# Patient Record
Sex: Female | Born: 1990 | Race: White | Hispanic: Yes | Marital: Single | Smoking: Never smoker
Health system: Southern US, Community
[De-identification: ages and names within clinical notes are randomized; demographics above are authoritative.]

## PROBLEM LIST (undated history)

## (undated) ENCOUNTER — Inpatient Hospital Stay: Payer: Self-pay

## (undated) DIAGNOSIS — O09293 Supervision of pregnancy with other poor reproductive or obstetric history, third trimester: Secondary | ICD-10-CM

## (undated) DIAGNOSIS — Z8759 Personal history of other complications of pregnancy, childbirth and the puerperium: Secondary | ICD-10-CM

## (undated) DIAGNOSIS — F329 Major depressive disorder, single episode, unspecified: Secondary | ICD-10-CM

## (undated) DIAGNOSIS — Z91410 Personal history of adult physical and sexual abuse: Secondary | ICD-10-CM

## (undated) DIAGNOSIS — F32A Depression, unspecified: Secondary | ICD-10-CM

## (undated) DIAGNOSIS — Z915 Personal history of self-harm: Secondary | ICD-10-CM

## (undated) HISTORY — PX: NO PAST SURGERIES: SHX2092

---
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
## 2009-07-11 ENCOUNTER — Inpatient Hospital Stay: Payer: Self-pay | Admitting: Obstetrics and Gynecology

## 2010-03-12 ENCOUNTER — Emergency Department: Payer: Self-pay | Admitting: Emergency Medicine

## 2010-03-26 ENCOUNTER — Ambulatory Visit: Payer: Self-pay | Admitting: Family Medicine

## 2010-03-26 ENCOUNTER — Emergency Department: Payer: Self-pay | Admitting: Emergency Medicine

## 2010-03-29 ENCOUNTER — Emergency Department: Payer: Self-pay | Admitting: Emergency Medicine

## 2014-08-03 ENCOUNTER — Emergency Department: Payer: Self-pay | Admitting: Emergency Medicine

## 2015-07-16 ENCOUNTER — Emergency Department
Admission: EM | Admit: 2015-07-16 | Discharge: 2015-07-16 | Disposition: A | Discharge status: Home / Self Care | Payer: Self-pay | Attending: Emergency Medicine | Admitting: Emergency Medicine

## 2015-07-16 ENCOUNTER — Encounter: Payer: Self-pay | Admitting: Emergency Medicine

## 2015-07-16 ENCOUNTER — Emergency Department: Admission: EM | Admit: 2015-07-16 | Payer: Self-pay | Source: Home / Self Care

## 2015-07-16 DIAGNOSIS — Z7722 Contact with and (suspected) exposure to environmental tobacco smoke (acute) (chronic): Secondary | ICD-10-CM

## 2015-07-16 DIAGNOSIS — J705 Respiratory conditions due to smoke inhalation: Secondary | ICD-10-CM | POA: Insufficient documentation

## 2015-07-16 LAB — CARBOXYHEMOGLOBIN
Carboxyhemoglobin: 3.3 % (ref 1.5–9.0)
O2 SAT: 98.9 %
Total oxygen content: 92.7 mL/dL

## 2015-07-16 NOTE — ED Notes (Signed)
Okay to give meal tray and beverage to pt per Mcshane, MD  
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
## 2017-07-16 ENCOUNTER — Other Ambulatory Visit: Payer: Self-pay | Admitting: Advanced Practice Midwife

## 2017-07-16 DIAGNOSIS — Z369 Encounter for antenatal screening, unspecified: Secondary | ICD-10-CM

## 2017-07-16 LAB — OB RESULTS CONSOLE HIV ANTIBODY (ROUTINE TESTING): HIV: NONREACTIVE

## 2017-07-17 LAB — OB RESULTS CONSOLE RUBELLA ANTIBODY, IGM: RUBELLA: IMMUNE

## 2017-07-17 LAB — OB RESULTS CONSOLE VARICELLA ZOSTER ANTIBODY, IGG: Varicella: IMMUNE

## 2017-07-17 LAB — OB RESULTS CONSOLE ABO/RH: RH TYPE: POSITIVE

## 2017-07-17 LAB — OB RESULTS CONSOLE HEPATITIS B SURFACE ANTIGEN: HEP B S AG: NEGATIVE

## 2017-07-18 LAB — OB RESULTS CONSOLE GC/CHLAMYDIA
CHLAMYDIA, DNA PROBE: NEGATIVE
Gonorrhea: NEGATIVE

## 2017-07-31 ENCOUNTER — Ambulatory Visit (HOSPITAL_BASED_OUTPATIENT_CLINIC_OR_DEPARTMENT_OTHER)
Admission: RE | Admit: 2017-07-31 | Discharge: 2017-07-31 | Disposition: A | Discharge status: Home / Self Care | Payer: Self-pay | Source: Ambulatory Visit | Attending: Obstetrics and Gynecology | Admitting: Obstetrics and Gynecology

## 2017-07-31 ENCOUNTER — Ambulatory Visit
Admission: RE | Admit: 2017-07-31 | Discharge: 2017-07-31 | Disposition: A | Discharge status: Home / Self Care | Payer: Self-pay | Source: Ambulatory Visit | Attending: Obstetrics and Gynecology | Admitting: Obstetrics and Gynecology

## 2017-07-31 VITALS — BP 115/53 | HR 73 | Temp 98.1°F | Resp 18 | Wt 171.6 lb

## 2017-07-31 DIAGNOSIS — Z3682 Encounter for antenatal screening for nuchal translucency: Secondary | ICD-10-CM | POA: Insufficient documentation

## 2017-07-31 DIAGNOSIS — Z8279 Family history of other congenital malformations, deformations and chromosomal abnormalities: Secondary | ICD-10-CM | POA: Insufficient documentation

## 2017-07-31 DIAGNOSIS — N8312 Corpus luteum cyst of left ovary: Secondary | ICD-10-CM | POA: Insufficient documentation

## 2017-07-31 DIAGNOSIS — Z369 Encounter for antenatal screening, unspecified: Secondary | ICD-10-CM

## 2017-07-31 DIAGNOSIS — Z3689 Encounter for other specified antenatal screening: Secondary | ICD-10-CM | POA: Insufficient documentation

## 2017-07-31 NOTE — Progress Notes (Addendum)
Referring Provider:   [facility] Department Length of Consultation: 40 minutes   [name] was referred to [facility] for genetic counseling to discuss her family history and prenatal testing options.  This note is a summary of our discussion.  We first obtained a detailed family history. [name] reported that one paternal first cousin has Down syndrome. He is a teenager and has numerous siblings who are in good health.  He was born to his mother when she was at an older age. There is no other family history of children with birth defects, developmental delays, multiple miscarriages or chromosome conditions.  She also denied any complications during this pregnancy or exposures to medications, recreational drugs, tobacco or alcohol.  She reported preeclampsia in her first pregnancy.  She has three healthy children, and one early miscarriage.  This is the first pregnancy with her current partner.  We discussed that chromosomes are the inherited structures that contain our instructions for development (genes).  Each cell of our body normally has 46 chromosomes, matched up into 23 pairs.  The last pair determines our gender and are called the sex chromosomes.  A female has an X and a Y chromosome, while a female has two X chromosomes.  Rarely, when a mother's egg and father's sperm unite, an extra or missing chromosome can be passed on to the baby by mistake.  Changes in the number or the structure of the chromosomes may result in a child with some degree of mental retardation and physical problems.  Down syndrome is caused by having three copies (instead of the usual two copies) of the genes on chromosome number 21.  There are two types of Down syndrome.  Most often (about 95% of the time), Down syndrome is caused by an entire third copy of chromosome 21, known as Trisomy 33.  In the other cases, Down syndrome is caused by a rearrangement of the  chromosomes, known as a translocation.  Because we do not have documentation of the type of Down syndrome in this cousin, we discussed the recurrence of both types.  If it is the more common type, it is thought to be a sporadic event that would not be likely to happen again in the family and should not increase the chance for [name] to have a child with Down syndrome.  If her cousin has the translocation type of Down syndrome, the recurrence risk may be higher depending upon which, if any, family members may be translocation carriers.   We discussed the following prenatal screening and testing options for this pregnancy:  First trimester screening, which can include nuchal translucency ultrasound screen and/or first trimester maternal serum marker screening.  The nuchal translucency has approximately an 80% detection rate for Down syndrome and can be positive for other chromosome abnormalities as well as heart defects.  When combined with a maternal serum marker screening, the detection rate is up to 90% for Down syndrome and up to 97% for trisomy 18.     Maternal serum marker screening, a blood test that measures pregnancy proteins, can provide risk assessments for Down syndrome, trisomy 18, and open neural tube defects (spina bifida, anencephaly). Because it does not directly examine the fetus, it cannot positively diagnose or rule out these problems.  It is able to detect approximately 75% of babies with Down syndrome, 60% of babies with trisomy 18, and 80% of babies with spina bifida.  Targeted ultrasound  uses high frequency sound waves to create an image of the developing fetus.  An ultrasound is often recommended as a routine means of evaluating the pregnancy.  It is also used to screen for fetal anatomy problems (for example, a heart defect) that might be suggestive of a chromosomal or other abnormality.   Should either of these tests show an increased risk for a chromosome condition in  this pregnancy, then the following would be offered:   The chorionic villus sampling procedure is available for first trimester chromosome analysis.  This involves the withdrawal of a small amount of chorionic villi (tissue from the developing placenta).  Risk of pregnancy loss is estimated to be approximately 1 in 200 to 1 in 100 (0.5 to 1%).  There is approximately a 1% (1 in 100) chance that the CVS chromosome results will be unclear.  Chorionic villi cannot be tested for neural tube defects.     Amniocentesis involves the removal of a small amount of amniotic fluid from the sac surrounding the fetus ("bag of water") with the use of a thin needle inserted through the mother's abdomen and uterus.  Ultrasound guidance is used throughout the procedure.  Fetal cells from amniotic fluid are directly evaluated and > 99.5% of chromosome problems and > 98% of open neural tube defects can be detected. This procedure is generally performed after the 15th week of pregnancy.  The main risks to this procedure include complications leading to miscarriage in less than 1 in 200 cases (0.5%).We also obtained a detailed pregnancy history.  You reported no complications in the pregnancy.  You deny any illnesses, infections, recreational drugs, alcohol or tobacco.  You are currently taking Nifedipine for hypertension.  As with any medication during pregnancy, we must weigh the health of the mother against any possible adverse effects to the fetus from the medication.  Therefore, it is important that you continue any medications that you need for your continued good health.  This medication is not known to increase the risk for birth defects.  We also reviewed the availability of cell free fetal DNA testing from maternal blood to determine whether or not the baby may have either Down syndrome, trisomy 94, or trisomy 8.  This test utilizes a maternal blood sample and DNA sequencing technology to isolate circulating cell free  fetal DNA from maternal plasma.  The fetal DNA can then be analyzed for DNA sequences that are derived from the three most common chromosomes involved in aneuploidy, chromosomes 13, 18, and 21.  If the overall amount of DNA is greater than the expected level for any of these chromosomes, aneuploidy is suspected.  While we do not consider it a replacement for invasive testing and karyotype analysis, a negative result from this testing would be reassuring, though not a guarantee of a normal chromosome complement for the baby.  An abnormal result is certainly suggestive of an abnormal chromosome complement, though we would still recommend CVS or amniocentesis to confirm any findings from this testing.  Cystic Fibrosis and Spinal Muscular Atrophy (SMA) screening were also discussed with the patient. Both conditions are recessive, which means that both parents must be carriers in order to have a child with the disease.  Cystic fibrosis (CF) is one of the most common genetic conditions in persons of Caucasian ancestry.  This condition occurs in approximately 1 in 2,500 Caucasian persons and results in thickened secretions in the lungs, digestive, and reproductive systems.  For a baby to be at  risk for having CF, both of the parents must be carriers for this condition.  Approximately 1 in 7125 Caucasian persons is a carrier for CF.  Current carrier testing looks for the most common mutations in the gene for CF and can detect approximately 90% of carriers in the Caucasian population.  This means that the carrier screening can greatly reduce, but cannot eliminate, the chance for an individual to have a child with CF.  If an individual is found to be a carrier for CF, then carrier testing would be available for the partner. As part of [place] newborn screening profile, all babies born in the state of [place] will have a two-tier screening process.  Specimens are first tested to determine the concentration of  immunoreactive trypsinogen (IRT).  The top 5% of specimens with the highest IRT values then undergo DNA testing using a panel of over 40 common CF mutations. SMA is a neurodegenerative disorder that leads to atrophy of skeletal muscle and overall weakness.  This condition is also more prevalent in the Caucasian population, with 1 in 40-1 in 60 persons being a carrier and 1 in 6,000-1 in 10,000 children being affected.  There are multiple forms of the disease, with some causing death in infancy to other forms with survival into adulthood.  The genetics of SMA is complex, but carrier screening can detect up to 95% of carriers in the Caucasian population.  Similar to CF, a negative result can greatly reduce, but cannot eliminate, the chance to have a child with SMA.  We discussed the prenatal options in detail and after consideration, the patient elected to have first trimester screening.  She declined CF and SMA carrier screening.  We appreciate very much the opportunity to be involved with the care of this patient.  If the patient has further questions or concerns, please do not hesitate to call us at [phone].  Cherly Andersoneborah F. Wells, MS, CGC  I reviewed the counseling and agree with the plan Jimmey RalphLivingston, Meena Barrantes, MD

## 2017-08-04 ENCOUNTER — Ambulatory Visit
Admission: RE | Admit: 2017-08-04 | Discharge: 2017-08-04 | Disposition: A | Discharge status: Home / Self Care | Payer: Self-pay | Source: Ambulatory Visit | Attending: Advanced Practice Midwife | Admitting: Advanced Practice Midwife

## 2017-08-04 DIAGNOSIS — Z369 Encounter for antenatal screening, unspecified: Secondary | ICD-10-CM

## 2017-08-07 ENCOUNTER — Telehealth: Payer: Self-pay | Admitting: Obstetrics and Gynecology

## 2017-08-07 NOTE — Telephone Encounter (Signed)
Ms. Geanie CooleyOcampo Aranda  elected to undergo First Trimester screening on 07/31/2017.  To review, first trimester screening, includes nuchal translucency ultrasound screen and/or first trimester maternal serum marker screening.  The nuchal translucency has approximately an 80% detection rate for Down syndrome and can be positive for other chromosome abnormalities as well as heart defects.  When combined with a maternal serum marker screening, the detection rate is up to 90% for Down syndrome and up to 97% for trisomy 13 and 18.     The results of the First Trimester Nuchal Translucency and Biochemical Screening were within normal range.  The risk for Down syndrome is now estimated to be 1 in 1,374.  The risk for Trisomy 13/18 is less than 1 in 10,000.  Should more definitive information be desired, we would offer amniocentesis.  Because we do not yet know the effectiveness of combined first and second trimester screening, we do not recommend a maternal serum screen to assess the chance for chromosome conditions.  However, if screening for neural tube defects is desired, maternal serum screening for AFP only can be performed between 15 and [redacted] weeks gestation.     Cherly Andersoneborah F. Ovetta Bazzano, MS, CGC

## 2017-09-08 ENCOUNTER — Other Ambulatory Visit: Payer: Self-pay | Admitting: *Deleted

## 2017-09-08 DIAGNOSIS — Z8759 Personal history of other complications of pregnancy, childbirth and the puerperium: Secondary | ICD-10-CM

## 2017-09-11 ENCOUNTER — Ambulatory Visit
Admission: RE | Admit: 2017-09-11 | Discharge: 2017-09-11 | Disposition: A | Discharge status: Home / Self Care | Payer: Self-pay | Source: Ambulatory Visit | Attending: Maternal & Fetal Medicine | Admitting: Maternal & Fetal Medicine

## 2017-09-11 DIAGNOSIS — Z8759 Personal history of other complications of pregnancy, childbirth and the puerperium: Secondary | ICD-10-CM | POA: Insufficient documentation

## 2017-10-14 NOTE — L&D Delivery Note (Signed)
Delivery Note  First Stage: Induction: three doses of misoprostol Labor onset: contractions started around 2200 Analgesia /Anesthesia intrapartum: none SROM at 2310 for clear fluid  Second Stage: Complete dilation at 0020 Onset of pushing at 0020 FHR second stage category 1  She quickly progressed to complete and had a spontaneous vaginal birth of a live female over an intact perineum at 0023. The fetal head was delivered with restitution to ROA. Anterior then posterior shoulders delivered with minimal assistance. Baby placed on mom's chest and attended to by transition RN.   Third Stage: Placenta delivered spontaneously Schultz intact with 3 VC @ 0028 Placenta disposition: routine disposal Uterine tone firm / bleeding scant IV pitocin given for hemorrhage prophylaxis  No laceration identified  Anesthesia for repair: n/a Repair: n/a Est. Blood Loss (mL): 300mL  Complications: none  Newborn: Birth Weight: pending  Apgar Scores: 9, 9 Feeding planned: breast  Mom to postpartum.  Baby to Couplet care / Skin to Skin.  Krista [name], [place] 02/04/2018 3:32 AM

## 2017-11-24 LAB — HM HIV SCREENING LAB: HM HIV Screening: NEGATIVE

## 2017-11-25 LAB — OB RESULTS CONSOLE RPR: RPR: NONREACTIVE

## 2018-01-13 LAB — OB RESULTS CONSOLE GBS: STREP GROUP B AG: POSITIVE

## 2018-01-15 LAB — OB RESULTS CONSOLE GC/CHLAMYDIA
CHLAMYDIA, DNA PROBE: NEGATIVE
Gonorrhea: NEGATIVE

## 2018-01-27 ENCOUNTER — Other Ambulatory Visit: Payer: Self-pay

## 2018-01-27 ENCOUNTER — Observation Stay
Admission: EM | Admit: 2018-01-27 | Discharge: 2018-01-27 | Disposition: A | Discharge status: Home / Self Care | Payer: Self-pay | Attending: Obstetrics and Gynecology | Admitting: Obstetrics and Gynecology

## 2018-01-27 DIAGNOSIS — O26899 Other specified pregnancy related conditions, unspecified trimester: Secondary | ICD-10-CM | POA: Diagnosis present

## 2018-01-27 DIAGNOSIS — Z3A38 38 weeks gestation of pregnancy: Secondary | ICD-10-CM | POA: Insufficient documentation

## 2018-01-27 DIAGNOSIS — F329 Major depressive disorder, single episode, unspecified: Secondary | ICD-10-CM | POA: Insufficient documentation

## 2018-01-27 DIAGNOSIS — O99820 Streptococcus B carrier state complicating pregnancy: Principal | ICD-10-CM | POA: Insufficient documentation

## 2018-01-27 DIAGNOSIS — O99343 Other mental disorders complicating pregnancy, third trimester: Secondary | ICD-10-CM | POA: Insufficient documentation

## 2018-01-27 DIAGNOSIS — R197 Diarrhea, unspecified: Secondary | ICD-10-CM

## 2018-01-27 MED ORDER — CALCIUM CARBONATE ANTACID 500 MG PO CHEW: 2.0000 | CHEWABLE_TABLET | Freq: Three times a day (TID) | ORAL | 0 refills | Status: AC

## 2018-01-27 MED ORDER — CALCIUM CARBONATE ANTACID 500 MG PO CHEW
2.0000 | CHEWABLE_TABLET | Freq: Three times a day (TID) | ORAL | Status: DC
  Administered 2018-01-27: 400 mg via ORAL
  Filled 2018-01-27: qty 2

## 2018-01-27 NOTE — Discharge Summary (Signed)
Krista [name] is [name] 27 y.o. female. She is at 5741w2d gestation. Patient's last menstrual period was 05/09/2017. Estimated Date of Delivery: 02/08/18  Prenatal care site: [facility]   Current pregnancy complicated by:  1. GBS Pos 2. Hx Pre-e x 2 (G1 and G3) 3. Hx macrosomia with shoulder dystocia with G2   Chief complaint: Abd pain with diarrhea, unsure if she is in labor.   Location: general "all over abdomen" but points to epigastric region across abdomen.  Onset/timing: 0100 on 01/27/18 Severity: 6/10 cramping associated with episodes of diarrhea, mild constant pain.  Aggravating or alleviating conditions:none Associated signs/symptoms: occasional UCs   S: Resting comfortably. no CTX, no VB.no LOF,  Active fetal movement.  Denies: HA, visual changes, SOB, or RUQ pain.   Maternal Medical History:  No past medical history on file.  - Hx depression, prev on meds. Hx dental problems. Hx Pre-e x 2  No past surgical history on file. - No surgeries.  No Known Allergies  Prior to Admission medications   Medication Sig Start Date End Date Taking? Authorizing Provider  Prenatal Vit-Fe Fumarate-FA (PRENATAL MULTIVITAMIN) TABS tablet Take 1 tablet by mouth daily at 12 noon.    [provider]      Social History: She  reports that she has never smoked. She has never used smokeless tobacco. She reports that she does not drink alcohol or use drugs.  Family History:  no history of gyn cancers  Review of Systems: [name] full review of systems was performed and negative except as noted in the HPI.     O:  BP 114/66 (BP Location: Right Arm)   Temp 98.5 F (36.9 C) (Oral)   Ht 5\' 6"  (1.676 m)   Wt 206 lb (93.4 kg)   LMP 05/09/2017   BMI 33.25 kg/m  No results found for this or any previous visit (from the past 48 hour(s)).   Constitutional: NAD, AAOx3  HE/ENT: extraocular movements grossly intact, moist mucous membranes CV: RRR PULM: nl respiratory effort, CTABL     Abd:  gravid, non-tender, non-distended, soft      Ext: Non-tender, Nonedematous   Psych: mood appropriate, speech normal Pelvic: SVE done by nursing: 1-2/40/OOP  Fetal  monitoring: Cat I Appropriate for GA Baseline: 140bpm Variability: moderate Accelerations: absent/ present x >2 Decelerations absent Toco: irregular occasional UCs.     [name]/P: 27 y.o. 4041w2d here for antenatal surveillance for Abdominal pain and diarrhea  Labor: not present.  Diarrhea with abd cramping- reviewed preg safe medications, Bland diet, inc PO hydration.    Epigastric pain: TUMs 2 tabs given with complete relief.   Fetal Wellbeing: Reassuring Cat 1 tracing.  Reactive NST   D/c home stable, precautions reviewed, follow-up as scheduled.    Krista [name], CNM 01/27/2018  11:44 PM

## 2018-01-27 NOTE — OB Triage Note (Signed)
Pt discharged to home in stable condition. Discharge instructions reviewed with interpreter Emeline Gins(Andres [phone]). Patient verbalized understanding.

## 2018-01-27 NOTE — Discharge Instructions (Signed)
Diarrea en los adultos  (Diarrhea, Adult)  La diarrea ocurre cuando la materia fecal (heces) es frecuentemente blanda y acuosa. La diarrea puede hacerlo sentir dbil y causarle deshidratacin. La deshidratacin puede hacerlo sentir cansado y sediento, producirle sequedad en la boca y disminuir la frecuencia con la que orina. La diarrea suele durar 2 o 3das. Sin embargo, puede durar ms tiempo si se trata de un signo de algo ms serio. Es importante tratar la diarrea como se lo haya indicado el mdico.  CUIDADOS EN EL HOGAR  Comida y bebida  Siga estas recomendaciones como se lo haya indicado el mdico:   Tome una solucin de rehidratacin oral (SRO). Es una bebida que se vende en farmacias y tiendas.   Beba lquidos claros, por ejemplo:  ? Agua.  ? Cubitos de hielo.  ? Jugo de frutas diluido.  ? Bebidas deportivas de bajas caloras.   En la medida en que pueda, consuma alimentos blandos y fciles de digerir en pequeas cantidades. Ellos son:  ? Bananas.  ? Pur de manzana.  ? Arroz.  ? Carnes bajas en grasa (magras).  ? Tostadas.  ? Galletas.   Evite beber lquidos que contengan mucha azcar o cafena.   Evite el alcohol.   Evite los alimentos picantes o grasos.  Instrucciones generales   Beba suficiente lquido para mantener el pis (orina) claro o de color amarillo plido.   Lvese las manos con frecuencia. Use un desinfectante para manos si no dispone de agua y jabn.   Asegrese de que todas las personas que viven en su casa se laven bien las manos y con frecuencia.   Tome los medicamentos de venta libre y los recetados solamente como se lo haya indicado el mdico.   Descanse en su casa hasta sentirse mejor.   Controle su afeccin para ver si hay cambios.   Tome un bao con agua tibia para aliviar cualquier ardor o dolor a causa de la diarrea.   Concurra a todas las visitas de control como se lo haya indicado el mdico. Esto es importante.  SOLICITE AYUDA SI:   Tiene fiebre.   La diarrea  empeora.   Aparecen nuevos sntomas.   No puede retener los lquidos.   Se siente mareado o siente que va a desvanecerse.   Tiene dolores de cabeza.   Tiene calambres musculares.  SOLICITE AYUDA DE INMEDIATO SI:   Siente dolor en el pecho.   Se siente muy dbil o se desvanece (se desmaya).   Tiene heces con sangre, de color negro o con aspecto alquitranado.   Tiene dolor muy intenso en el vientre (abdomen), clicos o meteorismo.   Le cuesta respirar o respira muy rpidamente.   Su corazn late muy rpidamente.   Siente la piel fra y hmeda.   Se siente confundido.   Tiene signos de deshidratacin, por ejemplo:  ? La orina es oscura, es muy escasa o no orina.  ? Labios agrietados.  ? Boca seca.  ? Ojos hundidos.  ? Somnolencia.  ? Debilidad.  Esta informacin no tiene como fin reemplazar el consejo del mdico. Asegrese de hacerle al mdico cualquier pregunta que tenga.  Document Released: 09/16/2012 Document Revised: 01/22/2016 Document Reviewed: 06/06/2015  Elsevier Interactive Patient Education  2018 [employer]

## 2018-01-27 NOTE — OB Triage Note (Signed)
Pt states she has been having pain since 0100 that is a combination of constant and intermittent. She has less severe pain that she believes to be related to diarrhea that is constant and more severe pain that comes and goes rating 6/10. She is unsure if these pains are contractions.

## 2018-02-03 ENCOUNTER — Other Ambulatory Visit: Payer: Self-pay

## 2018-02-03 ENCOUNTER — Inpatient Hospital Stay
Admission: EM | Admit: 2018-02-03 | Discharge: 2018-02-05 | DRG: 805 | Disposition: A | Discharge status: Home / Self Care | Payer: Medicaid Other | Attending: Certified Nurse Midwife | Admitting: Certified Nurse Midwife

## 2018-02-03 DIAGNOSIS — Z3A39 39 weeks gestation of pregnancy: Secondary | ICD-10-CM | POA: Diagnosis not present

## 2018-02-03 DIAGNOSIS — O2662 Liver and biliary tract disorders in childbirth: Principal | ICD-10-CM | POA: Diagnosis present

## 2018-02-03 DIAGNOSIS — O99824 Streptococcus B carrier state complicating childbirth: Secondary | ICD-10-CM | POA: Diagnosis present

## 2018-02-03 DIAGNOSIS — K831 Obstruction of bile duct: Secondary | ICD-10-CM | POA: Diagnosis present

## 2018-02-03 DIAGNOSIS — Z3490 Encounter for supervision of normal pregnancy, unspecified, unspecified trimester: Secondary | ICD-10-CM | POA: Diagnosis present

## 2018-02-03 HISTORY — DX: Personal history of adult physical and sexual abuse: Z91.410

## 2018-02-03 HISTORY — DX: Personal history of self-harm: Z91.5

## 2018-02-03 HISTORY — DX: Major depressive disorder, single episode, unspecified: F32.9

## 2018-02-03 HISTORY — DX: Supervision of pregnancy with other poor reproductive or obstetric history, third trimester: O09.293

## 2018-02-03 HISTORY — DX: Depression, unspecified: F32.A

## 2018-02-03 HISTORY — DX: Personal history of other complications of pregnancy, childbirth and the puerperium: Z87.59

## 2018-02-03 LAB — COMPREHENSIVE METABOLIC PANEL
ALBUMIN: 2.8 g/dL — AB (ref 3.5–5.0)
ALK PHOS: 261 U/L — AB (ref 38–126)
ALT: 252 U/L — ABNORMAL HIGH (ref 14–54)
AST: 92 U/L — AB (ref 15–41)
Anion gap: 6 (ref 5–15)
BILIRUBIN TOTAL: 0.6 mg/dL (ref 0.3–1.2)
BUN: 11 mg/dL (ref 6–20)
CALCIUM: 8.6 mg/dL — AB (ref 8.9–10.3)
CO2: 21 mmol/L — ABNORMAL LOW (ref 22–32)
Chloride: 107 mmol/L (ref 101–111)
Creatinine, Ser: 0.54 mg/dL (ref 0.44–1.00)
GFR calc Af Amer: >60 mL/min (ref >60)
GFR calc non Af Amer: >60 mL/min (ref >60)
GLUCOSE: 107 mg/dL — AB (ref 65–99)
Potassium: 4.1 mmol/L (ref 3.5–5.1)
Sodium: 134 mmol/L — ABNORMAL LOW (ref 135–145)
TOTAL PROTEIN: 6.1 g/dL — AB (ref 6.5–8.1)

## 2018-02-03 LAB — TYPE AND SCREEN
ABO/RH(D): A POS
Antibody Screen: NEGATIVE

## 2018-02-03 LAB — CBC
HEMATOCRIT: 35.2 % (ref 35.0–47.0)
HEMOGLOBIN: 12.3 g/dL (ref 12.0–16.0)
MCH: 31.1 pg (ref 26.0–34.0)
MCHC: 35 g/dL (ref 32.0–36.0)
MCV: 88.8 fL (ref 80.0–100.0)
Platelets: 239 10*3/uL (ref 150–440)
RBC: 3.97 MIL/uL (ref 3.80–5.20)
RDW: 13.6 % (ref 11.5–14.5)
WBC: 9 10*3/uL (ref 3.6–11.0)

## 2018-02-03 MED ORDER — MISOPROSTOL 25 MCG QUARTER TABLET
25.0000 ug | ORAL_TABLET | ORAL | Status: DC | PRN
  Administered 2018-02-03 (×2): 25 ug via VAGINAL
  Filled 2018-02-03 (×2): qty 1

## 2018-02-03 MED ORDER — TERBUTALINE SULFATE 1 MG/ML IJ SOLN: 0.2500 mg | Freq: Once | INTRAMUSCULAR | Status: DC | PRN

## 2018-02-03 MED ORDER — LACTATED RINGERS IV SOLN
INTRAVENOUS | Status: DC
  Administered 2018-02-03: 17:00:00 via INTRAVENOUS

## 2018-02-03 MED ORDER — SODIUM CHLORIDE 0.9 % IV SOLN
5.0000 10*6.[IU] | Freq: Once | INTRAVENOUS | Status: AC
  Administered 2018-02-03: 5 10*6.[IU] via INTRAVENOUS
  Filled 2018-02-03: qty 5

## 2018-02-03 MED ORDER — LIDOCAINE HCL (PF) 1 % IJ SOLN
30.0000 mL | INTRAMUSCULAR | Status: DC | PRN
  Filled 2018-02-03: qty 30

## 2018-02-03 MED ORDER — ONDANSETRON HCL 4 MG/2ML IJ SOLN: 4.0000 mg | Freq: Four times a day (QID) | INTRAMUSCULAR | Status: DC | PRN

## 2018-02-03 MED ORDER — BUTORPHANOL TARTRATE 2 MG/ML IJ SOLN: 1.0000 mg | INTRAMUSCULAR | Status: DC | PRN

## 2018-02-03 MED ORDER — OXYTOCIN 40 UNITS IN LACTATED RINGERS INFUSION - SIMPLE MED
2.5000 [IU]/h | INTRAVENOUS | Status: DC
  Filled 2018-02-03 (×2): qty 1000

## 2018-02-03 MED ORDER — LACTATED RINGERS IV SOLN: 500.0000 mL | INTRAVENOUS | Status: DC | PRN

## 2018-02-03 MED ORDER — MISOPROSTOL 25 MCG QUARTER TABLET
25.0000 ug | ORAL_TABLET | Freq: Once | ORAL | Status: AC
  Administered 2018-02-03: 25 ug via BUCCAL
  Filled 2018-02-03: qty 1

## 2018-02-03 MED ORDER — ACETAMINOPHEN 325 MG PO TABS: 650.0000 mg | ORAL_TABLET | ORAL | Status: DC | PRN

## 2018-02-03 MED ORDER — OXYTOCIN BOLUS FROM INFUSION
500.0000 mL | Freq: Once | INTRAVENOUS | Status: AC
  Administered 2018-02-04: 500 mL via INTRAVENOUS

## 2018-02-03 MED ORDER — PENICILLIN G POT IN DEXTROSE 60000 UNIT/ML IV SOLN
3.0000 10*6.[IU] | INTRAVENOUS | Status: DC
  Administered 2018-02-03: 3 10*6.[IU] via INTRAVENOUS
  Filled 2018-02-03 (×6): qty 50

## 2018-02-03 NOTE — Progress Notes (Signed)
Labor Progress Note  Krista [name] is a 27 y.o. [id number] at 4741w2d by 7668w4d ultrasound admitted for induction of labor due to cholestais.  Subjective:  Patient starting to feel some cramping, but overall very comfortable.   Objective: BP (!) 109/39 (BP Location: Right Arm)   Pulse (!) 56   Temp 98.5 F (36.9 C) (Oral)   Resp 16   Ht 5\' 6"  (1.676 m)   Wt 93.9 kg (207 lb)   LMP 05/09/2017   BMI 33.41 kg/m   Fetal Assessment: FHT:  FHR: 150 bpm, variability: moderate,  accelerations:  Present,  decelerations:  Absent Category/reactivity:  Category I UC:   Uterine irritability SVE: per RN exam at 2140 by RN Krista [name] Dilation: 2cm  Effacement: 50%  Station:  -3  Position: posterior   Membrane status: intact Amniotic color: n/a  Labs: Lab Results  Component Value Date   WBC 9.0 02/03/2018   HGB 12.3 02/03/2018   HCT 35.2 02/03/2018   MCV 88.8 02/03/2018   PLT 239 02/03/2018    Assessment / Plan: Induction of labor due to cholestasis  Induction of Labor: s/p 2 doses of misoprostol, starting to have some uterine irritability Fetal Wellbeing:  Category I Pain Control:  Maternal pain control as desired: IVPM, nitrous, regional anesthesia Anticipated MOD:  NSVD  Genia DelMargaret Destyn Schuyler, CNM 02/03/2018, 9:57 PM
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
## 2018-02-04 ENCOUNTER — Encounter: Payer: Self-pay | Admitting: *Deleted

## 2018-02-04 LAB — CBC
HEMATOCRIT: 32.5 % — AB (ref 35.0–47.0)
Hemoglobin: 11.7 g/dL — ABNORMAL LOW (ref 12.0–16.0)
MCH: 32 pg (ref 26.0–34.0)
MCHC: 36.2 g/dL — ABNORMAL HIGH (ref 32.0–36.0)
MCV: 88.5 fL (ref 80.0–100.0)
Platelets: 208 10*3/uL (ref 150–440)
RBC: 3.67 MIL/uL — AB (ref 3.80–5.20)
RDW: 13.1 % (ref 11.5–14.5)
WBC: 12.9 10*3/uL — AB (ref 3.6–11.0)

## 2018-02-04 MED ORDER — COCONUT OIL OIL
1.0000 "application " | TOPICAL_OIL | Status: DC | PRN
  Administered 2018-02-04: 1 via TOPICAL
  Filled 2018-02-04: qty 120

## 2018-02-04 MED ORDER — OXYTOCIN 10 UNIT/ML IJ SOLN
INTRAMUSCULAR | Status: AC
  Administered 2018-02-04: 10 [IU]
  Filled 2018-02-04: qty 2

## 2018-02-04 MED ORDER — FENTANYL 2.5 MCG/ML W/ROPIVACAINE 0.15% IN NS 100 ML EPIDURAL (ARMC)
EPIDURAL | Status: AC
  Filled 2018-02-04: qty 100

## 2018-02-04 MED ORDER — SENNOSIDES-DOCUSATE SODIUM 8.6-50 MG PO TABS
2.0000 | ORAL_TABLET | ORAL | Status: DC
  Administered 2018-02-05: 2 via ORAL
  Filled 2018-02-04: qty 2

## 2018-02-04 MED ORDER — DIBUCAINE 1 % RE OINT: 1.0000 "application " | TOPICAL_OINTMENT | RECTAL | Status: DC | PRN

## 2018-02-04 MED ORDER — WITCH HAZEL-GLYCERIN EX PADS: 1.0000 "application " | MEDICATED_PAD | CUTANEOUS | Status: DC | PRN

## 2018-02-04 MED ORDER — PRENATAL MULTIVITAMIN CH
1.0000 | ORAL_TABLET | Freq: Every day | ORAL | Status: DC
  Administered 2018-02-04 – 2018-02-05 (×2): 1 via ORAL
  Filled 2018-02-04 (×2): qty 1

## 2018-02-04 MED ORDER — IBUPROFEN 600 MG PO TABS
600.0000 mg | ORAL_TABLET | Freq: Four times a day (QID) | ORAL | Status: DC
  Administered 2018-02-04 – 2018-02-05 (×6): 600 mg via ORAL
  Filled 2018-02-04 (×6): qty 1

## 2018-02-04 MED ORDER — SIMETHICONE 80 MG PO CHEW: 160.0000 mg | CHEWABLE_TABLET | Freq: Four times a day (QID) | ORAL | Status: DC | PRN

## 2018-02-04 MED ORDER — BENZOCAINE-MENTHOL 20-0.5 % EX AERO: 1.0000 "application " | INHALATION_SPRAY | CUTANEOUS | Status: DC | PRN

## 2018-02-04 MED ORDER — ACETAMINOPHEN 325 MG PO TABS: 650.0000 mg | ORAL_TABLET | ORAL | Status: DC | PRN
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
## 2018-02-04 NOTE — Discharge Summary (Signed)
Obstetric Discharge Summary   Patient ID: Patient Name: Krista [name] DOB: [birth year] MRN: [id number]  Date of Admission: 02/03/2018 Date of Delivery: 02/04/18 Delivered by: Genia Del, CNM Date of Discharge: 02/05/2018  Primary OB: [facility]  [id number] last menstrual period was 05/09/2017. EDC Estimated Date of Delivery: 02/08/18 Gestational Age at Delivery: [redacted]w[redacted]d   Antepartum complications:  1. Cholestasis at 39w with bile acids 21 2. GBS positive 3. Hx of delivery of macrosomic infant (9lb) with mild shoulder dystocia with second child 4. Hx of preeclampsia with first and third children 5. Hx of depression with suicide attempt  Admitting Diagnosis: Induction of labor for cholestasis at term  Secondary Diagnoses: Patient Active Problem List   Diagnosis Date Noted  . Encounter for induction of labor 02/03/2018  . Diarrhea during pregnancy 01/27/2018  . Family history of Down syndrome     Induction: misoprostol Complications: None Intrapartum complications/course: She quickly progressed to complete and had a spontaneous vaginal birth of a live female over an intact perineum at 11. The fetal head was delivered with restitution to ROA. Anterior then posterior shoulders delivered with minimal assistance. Baby placed on mom's chest and attended to by transition RN.  Delivery Type: spontaneous vaginal delivery Anesthesia: none Placenta: spontaneous Laceration: none Episiotomy: none  Newborn Data: Live born female "Christian Zettie Pho" Birth Weight: 7lb 15oz  APGAR: 9, 9  Newborn Delivery   Birth date/time:  02/04/2018 00:23:00 Delivery type:  Vaginal, Spontaneous     Postpartum Course  Patient had an uncomplicated postpartum course.  By time of discharge on PPD#1, her pain was controlled on oral pain medications; she had appropriate lochia and was ambulating, voiding without difficulty and tolerating regular diet.  She was deemed stable for discharge to home.        Labs: CBC Latest Ref Rng & Units 02/04/2018 02/03/2018  WBC 3.6 - 11.0 K/uL 12.9(H) 9.0  Hemoglobin 12.0 - 16.0 g/dL 11.7(L) 12.3  Hematocrit 35.0 - 47.0 % 32.5(L) 35.2  Platelets 150 - 440 K/uL 208 239   A POS  Physical exam:  BP (!) 99/53 (BP Location: Left Arm)   Pulse (!) 59   Temp 98.4 F (36.9 C) (Oral)   Resp 18   Ht 5\' 6"  (1.676 m)   Wt 207 lb (93.9 kg)   LMP 05/09/2017   SpO2 99%   Breastfeeding? Unknown   BMI 33.41 kg/m  General: alert and no distress Pulm: normal respiratory effort Lochia: appropriate Abdomen: soft, NT Uterine Fundus: firm, below umbilicus Extremities: No evidence of DVT seen on physical exam. No lower extremity edema.   Disposition: stable, discharge to home Baby Feeding: breastmilk and formula Baby Disposition: home with mom  Contraception: IUD or implant, pt undecided  Prenatal Labs:  Blood type/Rh A+  Antibody screen neg  Rubella Immune  Varicella Immune  RPR NR  HBsAg Neg  HIV NR  GC neg  Chlamydia neg  Genetic screening 1st trimester negative, AFP negative  1 hour GTT Early 89, 28w 111  3 hour GTT n/a  GBS positive    Rh Immune globulin given: n/a Rubella vaccine given: n/a Tdap vaccine given in AP or PP setting: 11/24/17 Flu vaccine given in AP or PP setting: 07/16/17  Plan:  Krista [name] was discharged to home in good condition. Follow-up appointment at [facility] with delivery provider in 6 weeks.  Discharge Instructions: Per After Visit Summary. Activity: Advance as tolerated. Pelvic rest for 6 weeks.   Diet: Regular  Discharge Medications: Allergies as of 02/05/2018   No Known Allergies     Medication List    TAKE these medications   acetaminophen 325 MG tablet Commonly known as:  TYLENOL Take 2 tablets (650 mg total) by mouth every 4 (four) hours as needed (for pain scale < 4).   calcium carbonate 500 MG chewable tablet Commonly known as:  TUMS - dosed in mg elemental calcium Chew 2  tablets (400 mg of elemental calcium total) by mouth 3 (three) times daily.   ibuprofen 600 MG tablet Commonly known as:  ADVIL,MOTRIN Take 1 tablet (600 mg total) by mouth every 6 (six) hours.   prenatal multivitamin Tabs tablet Take 1 tablet by mouth daily at 12 noon.      Outpatient follow up:  Follow-up Information    Genia DelHaviland, Margaret, CNM. Schedule an appointment as soon as possible for a visit in 6 week(s).   Specialty:  Certified Nurse Midwife Contact information: [address] [place] [id number] [phone]            Signed: Randa NgoMcVey, REBECCA A, CNM 02/05/2018 11:37 AM

## 2018-02-05 ENCOUNTER — Other Ambulatory Visit: Payer: Self-pay | Admitting: Obstetrics and Gynecology

## 2018-02-05 LAB — RPR: RPR Ser Ql: NONREACTIVE

## 2018-02-05 MED ORDER — IBUPROFEN 600 MG PO TABS: 600.0000 mg | ORAL_TABLET | Freq: Four times a day (QID) | ORAL | 0 refills | Status: DC

## 2018-02-05 MED ORDER — ACETAMINOPHEN 325 MG PO TABS: 650.0000 mg | ORAL_TABLET | ORAL | Status: DC | PRN

## 2018-02-05 NOTE — Progress Notes (Signed)
Post Partum Day 1  Subjective: Doing well, no complaints.  Tolerating regular diet, pain with PO meds, voiding and ambulating without difficulty. No CP SOB Fever,Chills, N/V or leg pain; denies nipple or breast pain. no HA change of vision, RUQ/epigastric pain  Objective: BP (!) 99/53 (BP Location: Left Arm)   Pulse (!) 59   Temp 98.4 F (36.9 C) (Oral)   Resp 18   Ht 5\' 6"  (1.676 m)   Wt 207 lb (93.9 kg)   LMP 05/09/2017   SpO2 99%   Breastfeeding? Unknown   BMI 33.41 kg/m    Physical Exam:  General: NAD Breasts: soft/nontender CV: RRR Pulm: nl effort, CTABL Abdomen: soft, NT, BS x 4 Perineum: minimal edema, intact Lochia: small Uterine Fundus: fundus firm and 2 fb below umbilicus DVT Evaluation: no cords, ttp LEs   Recent Labs    02/03/18 1649 02/04/18 0626  HGB 12.3 11.7*  HCT 35.2 32.5*  WBC 9.0 12.9*  PLT 239 208    Assessment/Plan: 27 y.o. [id number] postpartum day # 1  - Continue routine PP care - having no issues breastfeeding.  - Discussed contraceptive options including implant, IUDs hormonal and non-hormonal, injection, pills/ring/patch, condoms, and NFP: desires IUD or implant, unsure which was discussed at [facility].  - Immunization status: all Imms up to date   Disposition: Does desire Dc home today.    McVey, REBECCA A, CNM 02/05/2018  10:54 AM

## 2018-02-05 NOTE — Progress Notes (Signed)
Spoke with Steward DroneBrenda with Care Management about patient request for information about Medicaid.  Steward DroneBrenda told RN to educate patient that she could get information from the health department.

## 2018-02-05 NOTE — Progress Notes (Signed)
Pt discharged with infant.  Discharge instructions, prescriptions and follow up appointment given to and reviewed with pt with interpreter. Pt verbalized understanding. Escorted out by auxillary. 

## 2018-04-01 LAB — HM PAP SMEAR: HM Pap smear: NEGATIVE

## 2020-05-01 ENCOUNTER — Emergency Department
Admission: EM | Admit: 2020-05-01 | Discharge: 2020-05-01 | Disposition: A | Discharge status: Home / Self Care | Payer: Worker's Compensation | Attending: Emergency Medicine | Admitting: Emergency Medicine

## 2020-05-01 ENCOUNTER — Other Ambulatory Visit: Payer: Self-pay

## 2020-05-01 ENCOUNTER — Encounter: Payer: Self-pay | Admitting: Emergency Medicine

## 2020-05-01 DIAGNOSIS — X158XXA Contact with other hot household appliances, initial encounter: Secondary | ICD-10-CM | POA: Diagnosis not present

## 2020-05-01 DIAGNOSIS — Y93G1 Activity, food preparation and clean up: Secondary | ICD-10-CM | POA: Insufficient documentation

## 2020-05-01 DIAGNOSIS — T22211A Burn of second degree of right forearm, initial encounter: Secondary | ICD-10-CM | POA: Insufficient documentation

## 2020-05-01 DIAGNOSIS — Y990 Civilian activity done for income or pay: Secondary | ICD-10-CM | POA: Diagnosis not present

## 2020-05-01 DIAGNOSIS — Y92511 Restaurant or cafe as the place of occurrence of the external cause: Secondary | ICD-10-CM | POA: Diagnosis not present

## 2020-05-01 MED ORDER — CEPHALEXIN 500 MG PO CAPS: 500.0000 mg | ORAL_CAPSULE | Freq: Two times a day (BID) | ORAL | 0 refills | Status: DC
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
## 2020-05-01 NOTE — ED Notes (Signed)
Worker's Comp/Urine drug screen was completed and sent to lab.

## 2020-05-01 NOTE — Discharge Instructions (Signed)
Follow-up with [facility] acute care or your companies Workmen's Comp. doctor.  If they do not have a specific doctor he may return to the emergency department.  Begin taking the Keflex twice a day for the next 5 days.  Clean the area daily with mild soap and water and watch for any signs of infection.  You will definitely need to cover the area when you are working.
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
## 2020-05-16 ENCOUNTER — Other Ambulatory Visit: Payer: Self-pay

## 2020-05-16 ENCOUNTER — Ambulatory Visit: Payer: Self-pay | Admitting: Physician Assistant

## 2020-05-16 DIAGNOSIS — Z113 Encounter for screening for infections with a predominantly sexual mode of transmission: Secondary | ICD-10-CM

## 2020-05-16 DIAGNOSIS — Z299 Encounter for prophylactic measures, unspecified: Secondary | ICD-10-CM

## 2020-05-16 MED ORDER — AZITHROMYCIN 500 MG PO TABS
1000.0000 mg | ORAL_TABLET | Freq: Once | ORAL | Status: AC
  Administered 2020-05-16: 1000 mg via ORAL

## 2020-05-16 NOTE — Progress Notes (Signed)
Pt received Azithromycin 1g po DOT per Sadie Haber, PA verbal and written order. Counseled pt per provider orders and pt states understanding. Provider orders completed.

## 2020-05-18 ENCOUNTER — Encounter: Payer: Self-pay | Admitting: Physician Assistant

## 2020-05-18 NOTE — Progress Notes (Signed)
[facility] Department STI clinic/screening visit  Subjective:  Editha Bridgeforth is a 29 y.o. female being seen today for an STI screening visit. The patient reports they do have symptoms.  Patient reports that they do not desire a pregnancy in the next year.   They reported they are not interested in discussing contraception today.  Patient's last menstrual period was 05/07/2020.   Patient has the following medical conditions:   Patient Active Problem List   Diagnosis Date Noted  . Encounter for induction of labor 02/03/2018  . Diarrhea during pregnancy 01/27/2018  . Family history of Down syndrome     Chief Complaint  Patient presents with  . SEXUALLY TRANSMITTED DISEASE    screening    HPI  Patient reports that she has had white discharge with itching for 5 days.  States that she had these same symptoms a year or two ago when she tested positive for Chlamydia and thinks that her partner never got treated for Chlamydia.  States that she has been taking an antibiotic for a burn on her arm and the last dose was 1 week ago.  States that she has also been using an OTC antifungal cream for the last 3 nights and the itching has improved, but she is concerned that she still has Chlamydia.  States last HIV testing and pap was in 2019.   See flowsheet for further details and programmatic requirements.    The following portions of the patient's history were reviewed and updated as appropriate: allergies, current medications, past medical history, past social history, past surgical history and problem list.  Objective:  There were no vitals filed for this visit.  Physical Exam Constitutional:      General: She is not in acute distress.    Appearance: Normal appearance.  HENT:     Head: Normocephalic and atraumatic.  Eyes:     Conjunctiva/sclera: Conjunctivae normal.  Pulmonary:     Effort: Pulmonary effort is normal.  Skin:    General: Skin is warm and dry.   Neurological:     Mental Status: She is alert and oriented to person, place, and time.  Psychiatric:        Mood and Affect: Mood normal.        Behavior: Behavior normal.        Thought Content: Thought content normal.        Judgment: Judgment normal.      Assessment and Plan:  [place] Aimy Sweeting is a 29 y.o. female presenting to the [facility] Department for STI screening  1. Screening for STD (sexually transmitted disease) Patient into clinic without symptoms. Counseled patient that likely she is having the itching due to taking the antibiotic for her burn and that using the cream should clear it up. Counseled that since she has used the cream that any testing will not be accurate. Counseled patient to RTC for testing in about 3-4 weeks to make sure the Chlamydia test is negative, and make sure partner tells the provider that he sees that he needs to be treated for Chlamydia and completes his medicine. Rec condoms with all sex. Await test results.  Counseled that RN will call if needs to RTC for treatment once results are back.  2. Prophylactic measure Will treat to cover for Chlamydia with Azithromycin 1 g po DOT. No sex for 7 days and until after partner completes treatment. RTC for re-treatment if vomits < 2 hr after taking medicine. -  azithromycin (ZITHROMAX) tablet 1,000 mg     Return for ~1 month for screening, and PRN.  No future appointments.  Matt Holmes, PA

## 2020-05-29 ENCOUNTER — Other Ambulatory Visit (LOCAL_COMMUNITY_HEALTH_CENTER): Payer: Self-pay

## 2020-05-29 DIAGNOSIS — Z3009 Encounter for other general counseling and advice on contraception: Secondary | ICD-10-CM

## 2020-05-30 ENCOUNTER — Other Ambulatory Visit (LOCAL_COMMUNITY_HEALTH_CENTER): Payer: Self-pay

## 2020-05-30 MED ORDER — MULTIVITAMINS PO CAPS: 1.0000 | ORAL_CAPSULE | Freq: Every day | ORAL | 0 refills | Status: DC

## 2020-05-30 MED ORDER — MULTIVITAMINS PO CAPS: 1.0000 | ORAL_CAPSULE | Freq: Every day | ORAL | 0 refills | Status: AC

## 2020-05-30 NOTE — Progress Notes (Signed)
Pt arrived in [facility] on 05/29/2020. Pt is a current pt at [facility]. Pt requests MVI and states that she is still able to conceive. MVI dispensed per Dr. Kimberly Newton standing order. °

## 2020-05-30 NOTE — Progress Notes (Deleted)
Pt arrived in [facility] on 05/29/2020. Pt is a current pt at [facility]. Pt requests MVI and states that she is still able to conceive. MVI dispensed per Dr. Lyndel Safe standing order.

## 2020-05-30 NOTE — Telephone Encounter (Signed)
See 05/29/20 encounter opened (states for LAB appt) for dispensing of multivitamins.

## 2021-02-23 ENCOUNTER — Emergency Department: Payer: Self-pay

## 2021-02-23 ENCOUNTER — Other Ambulatory Visit: Payer: Self-pay

## 2021-02-23 ENCOUNTER — Encounter: Payer: Self-pay | Admitting: Intensive Care

## 2021-02-23 ENCOUNTER — Emergency Department
Admission: EM | Admit: 2021-02-23 | Discharge: 2021-02-23 | Disposition: A | Discharge status: Home / Self Care | Payer: Self-pay | Attending: Emergency Medicine | Admitting: Emergency Medicine

## 2021-02-23 DIAGNOSIS — Y92410 Unspecified street and highway as the place of occurrence of the external cause: Secondary | ICD-10-CM | POA: Insufficient documentation

## 2021-02-23 DIAGNOSIS — S29019A Strain of muscle and tendon of unspecified wall of thorax, initial encounter: Secondary | ICD-10-CM

## 2021-02-23 DIAGNOSIS — S161XXA Strain of muscle, fascia and tendon at neck level, initial encounter: Secondary | ICD-10-CM | POA: Insufficient documentation

## 2021-02-23 DIAGNOSIS — S29012A Strain of muscle and tendon of back wall of thorax, initial encounter: Secondary | ICD-10-CM | POA: Insufficient documentation

## 2021-02-23 DIAGNOSIS — R0789 Other chest pain: Secondary | ICD-10-CM | POA: Insufficient documentation

## 2021-02-23 LAB — POC URINE PREG, ED: Preg Test, Ur: NEGATIVE

## 2021-02-23 MED ORDER — METHOCARBAMOL 500 MG PO TABS
500.0000 mg | ORAL_TABLET | Freq: Once | ORAL | Status: AC
  Administered 2021-02-23: 500 mg via ORAL
  Filled 2021-02-23: qty 1

## 2021-02-23 MED ORDER — ACETAMINOPHEN 500 MG PO TABS
500.0000 mg | ORAL_TABLET | Freq: Once | ORAL | Status: AC
  Administered 2021-02-23: 500 mg via ORAL
  Filled 2021-02-23: qty 1

## 2021-02-23 MED ORDER — METHOCARBAMOL 500 MG PO TABS: 500.0000 mg | ORAL_TABLET | Freq: Four times a day (QID) | ORAL | 0 refills | Status: AC | PRN

## 2021-02-23 MED ORDER — HYDROCODONE-ACETAMINOPHEN 5-325 MG PO TABS: 1.0000 | ORAL_TABLET | Freq: Four times a day (QID) | ORAL | 0 refills | Status: AC | PRN
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
## 2021-02-23 NOTE — Discharge Instructions (Addendum)
Follow-up with your primary care provider if any continued problems or concerns.  You will be sore and stiff for the next 4 to 5 days even with medication.  The 2 medications that were sent to the pharmacy should be taken with caution.  The hydrocodone is a pain medication and can cause drowsiness.  The methocarbamol is a muscle relaxant and also can cause drowsiness.  Do not drive or operate machinery while taking these medications as it could cause increase for injury.  You may use moist heat or ice to your muscles as needed for discomfort.
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
## 2023-11-27 ENCOUNTER — Ambulatory Visit: Payer: Self-pay

## 2023-11-27 ENCOUNTER — Encounter: Payer: Self-pay | Admitting: Advanced Practice Midwife

## 2023-11-27 VITALS — BP 108/57 | HR 48 | Wt 180.0 lb

## 2023-11-27 DIAGNOSIS — Z30431 Encounter for routine checking of intrauterine contraceptive device: Secondary | ICD-10-CM

## 2023-11-27 DIAGNOSIS — F17200 Nicotine dependence, unspecified, uncomplicated: Secondary | ICD-10-CM

## 2023-11-27 DIAGNOSIS — T1491XA Suicide attempt, initial encounter: Secondary | ICD-10-CM | POA: Insufficient documentation

## 2023-11-27 DIAGNOSIS — Z3009 Encounter for other general counseling and advice on contraception: Secondary | ICD-10-CM

## 2023-11-27 DIAGNOSIS — F53 Postpartum depression: Secondary | ICD-10-CM | POA: Insufficient documentation

## 2023-11-27 DIAGNOSIS — E669 Obesity, unspecified: Secondary | ICD-10-CM | POA: Insufficient documentation

## 2023-11-27 LAB — WET PREP FOR TRICH, YEAST, CLUE
Trichomonas Exam: NEGATIVE
Yeast Exam: NEGATIVE

## 2023-11-27 LAB — HEPATITIS B SURFACE ANTIGEN: Hepatitis B Surface Ag: NONREACTIVE

## 2023-11-27 LAB — HEMOGLOBIN, FINGERSTICK: Hemoglobin: 13.6 g/dL (ref 11.1–15.9)

## 2023-11-27 LAB — HM HEPATITIS C SCREENING LAB: HM Hepatitis Screen: NEGATIVE

## 2023-11-27 LAB — HM HIV SCREENING LAB: HM HIV Screening: NEGATIVE

## 2023-11-27 NOTE — Progress Notes (Signed)
Pt is here for PE, pap smear, STD testing and IUD check.  Pt notified of normal Hgb results.  Wet mount results reviewed, no treatment required per SO.  Berdie Ogren, RN

## 2023-11-27 NOTE — Progress Notes (Signed)
[employer] HE[facility] DEPARTMENT [facility] [address], [address] [place] [address] Main phone: [phone]  Family Planning Visit - Initial Visit  Subjective:  Krista [name] is a 33 y.o. SHF exsmoker  [id number] [phone])  being seen today for an initial annual visit and to discuss reproductive life planning.  The patient is currently using IUD (or IUS) for pregnancy prevention. Patient does not want a pregnancy in the next year.   Patient reports they are looking for a method with the following characteristics:  High efficacy at preventing pregnancy  Patient has the following medical conditions: There are no active problems to display for this patient.   Chief Complaint  Patient presents with   Annual Exam    PE, pap smear, IUD check and STD testing    HPI Patient reports here for physical,pap, IUD check. C/o LLQ pain, heavy bleeding with mense, post coital spotting with this partner for 9 months. Paraguard inserted 04/01/2018. LMP 11/19/23. Last sex 11/22/23 without condom; with current partner x 9 months; 1 partner in last 3 months. Last cig 3 months ago. Last vaped 2 years ago. Last ETOH 11/25/23 (2 beers) 2x/wk. Last dental exam 3 years ago. Highest completed grade 7th. 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 Living with her 4 kids.   Patient denies cigars, MJ  Review of Systems  Constitutional:  Positive for weight loss (10 lb wt gain in last year; not exercising; suggestions given).    Diabetes screening This patient is 33 y.o. with a BMI of Body mass index is 29.05 kg/m.Marland Kitchen  Is patient eligible for diabetes screening (age >35 and BMI >25)?  not applicable  Was Hgb A1c ordered? not applicable  STI screening Patient reports 2 of partners in last year.  Does this patient desire STI screening?  Yes  Hepatitis C screening Has patient been screened once for HCV in the past?  Yes  No results found for: "HCVAB"  Does the patient meet  criteria for HCV testing? yes (If yes-- Screen for HCV through [facility]) Criteria:  Since the last HCV result, does the patient have any of the following? - Current drug use - Have a partner with drug use - Has been incarcerated  Hepatitis B screening Does the patient meet criteria for HBV testing? No Criteria:  -Household, sexual or needle sharing contact with HBV -History of drug use -HIV positive -Those with known Hep C  Cervical Cancer Screening  Result Date Procedure Results Follow-ups  04/01/2018 HM PAP SMEAR HM Pap smear: Negative     Health Maintenance Due  Topic Date Due   Hepatitis C Screening  Never done   DTaP/Tdap/Td (1 - Tdap) Never done   Cervical Cancer Screening (HPV/Pap Cotest)  04/01/2021   INFLUENZA VACCINE  Never done   COVID-19 Vaccine (1 - 2024-25 season) Never done    The following portions of the patient's history were reviewed and updated as appropriate: allergies, current medications, past family history, past medical history, past social history, past surgical history and problem list. Problem list updated.  See flowsheet for other program required questions.  Objective:   Vitals:   11/27/23 0924  BP: (!) 108/57  Pulse: (!) 48  Weight: 180 lb (81.6 kg)    Physical Exam Constitutional:      Appearance: Normal appearance. She is normal weight.  HENT:     Head: Normocephalic and atraumatic.     Mouth/Throat:     Mouth: Mucous membranes are  moist.     Comments: Fair dentition but c/o molar pain; last dental exam 3 years ago Eyes:     Conjunctiva/sclera: Conjunctivae normal.  Neck:     Thyroid: No thyroid mass, thyromegaly or thyroid tenderness.  Cardiovascular:     Rate and Rhythm: Normal rate and regular rhythm.  Pulmonary:     Effort: Pulmonary effort is normal.     Breath sounds: Normal breath sounds.  Chest:  Breasts:    Right: Normal.     Left: Normal.  Abdominal:     Palpations: Abdomen is soft.     Comments: Soft  without masses or tenderness, fair tone  Genitourinary:    General: Normal vulva.     Exam position: Lithotomy position.     Pubic Area: No pubic lice.      Vagina: Vaginal discharge (grey creamy leukorrhea, ph>4.5) present.     Cervix: Normal.     Uterus: Normal.      Adnexa: Right adnexa normal and left adnexa normal.     Rectum: Normal.     Comments: No evidence of nits Pap done Marlene Yemen chaperone Paraguard string visualized Musculoskeletal:        General: Normal range of motion.     Cervical back: Normal range of motion and neck supple.  Skin:    General: Skin is warm and dry.  Neurological:     Mental Status: She is alert.  Psychiatric:        Mood and Affect: Mood normal.    Assessment and Plan:  Krista [name] is a 33 y.o. female presenting to the [facility] Department for an initial annual wellness/contraceptive visit  Contraception counseling: Reviewed options based on patient desire and reproductive life plan. Patient is interested in IUD or IUS. This is in situ provided to the patient today.  if not why not clearly documented  Risks, benefits, and typical effectiveness rates were reviewed.  Questions were answered.  Written information was also given to the patient to review.    The patient will follow up in  1 years for surveillance.  The patient was told to call with any further questions, or with any concerns about this method of contraception.  Emphasized use of condoms 100% of the time for STI prevention.  Educated on ECP and assessed for need of ECP. Patient reported not meeting criteria.  Reviewed options and patient desired No method of ECP, declined all    1. Family planning (Primary) Treat wet mount per standing orders Immunization nurse consult Declined counseling  - WET PREP FOR TRICH, YEAST, CLUE - Hemoglobin, venipuncture - Chlamydia/Gonorrhea [facility] - HIV/HCV [facility] - Syphilis Serology, [facility] - IGP,  Aptima HPV  2. Encounter for routine checking of intrauterine contraceptive device (IUD)   [facility] Department [facility]  IUD STRING CHECK PROGRESS NOTE  History:  32 y.o. [id number] here today for today for IUD string check; Paragard was placed  04/01/2018. No complaints about the Paragard, no concerning side effects.  The following portions of the patient's history were reviewed and updated as appropriate: allergies, current medications, past family history, past medical history, past social history, past surgical history and problem list. Last pap smear on 04/01/2018 was normal  Review of Systems:  Pertinent items are noted in HPI.   Objective:  Physical Exam Blood pressure (!) 108/57, pulse (!) 48, weight 180 lb (81.6 kg), last menstrual period 11/19/2023. Gen: NAD Abd:  Soft, nontender and nondistended Pelvic: Normal appearing external genitalia; normal appearing vaginal mucosa and cervix.  IUD strings visualized, about 3 cm in length outside cervix.   Assessment & Plan:  Normal IUD check. Patient to keep IUD in place for five years; can come in for removal if she desires pregnancy within the next five years. Routine preventative health maintenance measures emphasized  Alberteen Spindle, CNM     No follow-ups on file.  No future appointments. Due to language barrier, a Spanish interpreter Aaron Mose.) was present in person during the history-taking, subsequent discussion, and physical exam with this patient.     Alberteen Spindle, CNM

## 2023-12-02 NOTE — Addendum Note (Signed)
Addended by: Berdie Ogren on: 12/02/2023 10:15 AM   Modules accepted: Orders

## 2023-12-02 NOTE — Addendum Note (Signed)
Addended by: Berdie Ogren on: 12/02/2023 10:14 AM   Modules accepted: Orders

## 2023-12-06 LAB — IGP, APTIMA HPV
HPV Aptima: NEGATIVE
PAP Smear Comment: 0
